# Patient Record
Sex: Female | Born: 2000
Health system: Southern US, Community
[De-identification: ages and names within clinical notes are randomized; demographics above are authoritative.]

## PROBLEM LIST (undated history)

## (undated) HISTORY — PX: MYRINGOTOMY: SUR874

---
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
## 2021-02-20 MED FILL — LARIN FE 1.5-30 TABLET: 1.5-30 | 84 days supply | Qty: 84 | Fill #1

## 2021-05-15 DIAGNOSIS — Z20828 Contact with and (suspected) exposure to other viral communicable diseases: Secondary | ICD-10-CM | POA: Diagnosis not present

## 2021-05-25 DIAGNOSIS — Z20828 Contact with and (suspected) exposure to other viral communicable diseases: Secondary | ICD-10-CM | POA: Diagnosis not present

## 2021-09-26 DIAGNOSIS — J029 Acute pharyngitis, unspecified: Secondary | ICD-10-CM | POA: Diagnosis not present

## 2021-09-27 ENCOUNTER — Telehealth: Payer: 59 | Admitting: Physician Assistant

## 2021-09-27 DIAGNOSIS — H1031 Unspecified acute conjunctivitis, right eye: Secondary | ICD-10-CM | POA: Diagnosis not present

## 2021-09-27 MED ORDER — POLYMYXIN B-TRIMETHOPRIM 10000-0.1 UNIT/ML-% OP SOLN: 1.0000 [drp] | OPHTHALMIC | 0 refills | Status: DC

## 2021-09-27 NOTE — Progress Notes (Signed)

## 2021-09-29 DIAGNOSIS — R059 Cough, unspecified: Secondary | ICD-10-CM | POA: Diagnosis not present

## 2021-09-29 DIAGNOSIS — Z03818 Encounter for observation for suspected exposure to other biological agents ruled out: Secondary | ICD-10-CM | POA: Diagnosis not present

## 2021-09-29 DIAGNOSIS — J039 Acute tonsillitis, unspecified: Secondary | ICD-10-CM | POA: Diagnosis not present

## 2021-09-29 DIAGNOSIS — R0981 Nasal congestion: Secondary | ICD-10-CM | POA: Diagnosis not present

## 2021-09-29 DIAGNOSIS — R509 Fever, unspecified: Secondary | ICD-10-CM | POA: Diagnosis not present

## 2021-11-08 DIAGNOSIS — R21 Rash and other nonspecific skin eruption: Secondary | ICD-10-CM | POA: Diagnosis not present

## 2021-12-01 DIAGNOSIS — Z0000 Encounter for general adult medical examination without abnormal findings: Secondary | ICD-10-CM | POA: Diagnosis not present

## 2021-12-31 ENCOUNTER — Telehealth: Payer: 59 | Admitting: Family

## 2021-12-31 DIAGNOSIS — J029 Acute pharyngitis, unspecified: Secondary | ICD-10-CM | POA: Diagnosis not present

## 2021-12-31 MED ORDER — AMOXICILLIN 500 MG PO CAPS: 500.0000 mg | ORAL_CAPSULE | Freq: Two times a day (BID) | ORAL | 0 refills | Status: AC

## 2021-12-31 NOTE — Progress Notes (Signed)
Virtual Visit Consent   Katherine Blanchard, you are scheduled for a virtual visit with a [facility] provider today.     Just as with appointments in the office, your consent must be obtained to participate.  Your consent will be active for this visit and any virtual visit you may have with one of our providers in the next 365 days.     If you have a MyChart account, a copy of this consent can be sent to you electronically.  All virtual visits are billed to your insurance company just like a traditional visit in the office.    As this is a virtual visit, video technology does not allow for your provider to perform a traditional examination.  This may limit your provider's ability to fully assess your condition.  If your provider identifies any concerns that need to be evaluated in person or the need to arrange testing (such as labs, EKG, etc.), we will make arrangements to do so.     Although advances in technology are sophisticated, we cannot ensure that it will always work on either your end or our end.  If the connection with a video visit is poor, the visit may have to be switched to a telephone visit.  With either a video or telephone visit, we are not always able to ensure that we have a secure connection.     I need to obtain your verbal consent now.   Are you willing to proceed with your visit today?    Katherine Blanchard has provided verbal consent on 12/31/2021 for a virtual visit (video or telephone).   Katherine Rodney, FNP   Date: 12/31/2021 4:16 PM   Virtual Visit via Video Note   I, Katherine Blanchard, connected with  Katherine Blanchard  ([id number], 06/21/2001) on 12/31/21 at  4:15 PM EST by a video-enabled telemedicine application and verified that I am speaking with the correct person using two identifiers.  Location: Patient: Virtual Visit Location Patient: Home Provider: Virtual Visit Location Provider: Home Office   I discussed the limitations of evaluation and management by  telemedicine and the availability of in person appointments. The patient expressed understanding and agreed to proceed.    History of Present Illness: Katherine Blanchard is a 21 y.o. who identifies as a female who was assigned female at birth, and is being seen today for sore throat.  HPI: Sore Throat  This is a new problem. The current episode started yesterday. The problem has been gradually worsening. There has been no fever. The pain is at a severity of 7/10. The pain is moderate. Associated symptoms include congestion, headaches, swollen glands and trouble swallowing. Pertinent negatives include no coughing, ear discharge, ear pain or shortness of breath. Associated symptoms comments: Tonsils enlarged and white patches bilaterally . She has had no exposure to strep or mono. She has tried acetaminophen and NSAIDs for the symptoms. The treatment provided mild relief.   Problems:  Patient Active Problem List   Diagnosis Date Noted   Strain of right hamstring 03/02/2020   Ingrown toenail 07/15/2018   Left wrist injury, initial encounter 12/11/2017    Allergies: No Known Allergies Medications:  Current Outpatient Medications:    amoxicillin (AMOXIL) 500 MG capsule, Take 1 capsule (500 mg total) by mouth 2 (two) times daily for 10 days., Disp: 20 capsule, Rfl: 0  Observations/Objective: Patient is well-developed, well-nourished in no acute distress.  Resting comfortably  at home.  Head is normocephalic, atraumatic.  No labored breathing.  Speech is clear and coherent with logical content.  Patient is alert and oriented at baseline.  Tonsils enlarged 2+, white patches  Talking like hot potatoes in mouth.   Assessment and Plan: 1. Acute pharyngitis, unspecified etiology - amoxicillin (AMOXIL) 500 MG capsule; Take 1 capsule (500 mg total) by mouth 2 (two) times daily for 10 days.  Dispense: 20 capsule; Refill: 0  - Take meds as prescribed - Use a cool mist humidifier  -Use saline nose  sprays frequently -Force fluids -For any cough or congestion  Use plain Mucinex- regular strength or max strength is fine -For fever or aces or pains- take tylenol or ibuprofen. -Throat lozenges if help -New toothbrush in 3 days   Katherine Rodney, FNP   Follow Up Instructions: I discussed the assessment and treatment plan with the patient. The patient was provided an opportunity to ask questions and all were answered. The patient agreed with the plan and demonstrated an understanding of the instructions.  A copy of instructions were sent to the patient via MyChart unless otherwise noted below.     The patient was advised to call back or seek an in-person evaluation if the symptoms worsen or if the condition fails to improve as anticipated.  Time:  I spent 8 minutes with the patient via telehealth technology discussing the above problems/concerns.    Katherine Rodney, FNP

## 2022-02-19 DIAGNOSIS — E282 Polycystic ovarian syndrome: Secondary | ICD-10-CM | POA: Diagnosis not present

## 2022-02-19 DIAGNOSIS — N912 Amenorrhea, unspecified: Secondary | ICD-10-CM | POA: Diagnosis not present

## 2022-04-04 DIAGNOSIS — E282 Polycystic ovarian syndrome: Secondary | ICD-10-CM | POA: Diagnosis not present

## 2022-04-05 DIAGNOSIS — N926 Irregular menstruation, unspecified: Secondary | ICD-10-CM | POA: Diagnosis not present

## 2022-04-05 DIAGNOSIS — E282 Polycystic ovarian syndrome: Secondary | ICD-10-CM | POA: Diagnosis not present

## 2022-04-30 DIAGNOSIS — J029 Acute pharyngitis, unspecified: Secondary | ICD-10-CM | POA: Diagnosis not present

## 2022-05-22 DIAGNOSIS — N760 Acute vaginitis: Secondary | ICD-10-CM | POA: Diagnosis not present

## 2022-05-22 DIAGNOSIS — N921 Excessive and frequent menstruation with irregular cycle: Secondary | ICD-10-CM | POA: Diagnosis not present

## 2022-05-22 DIAGNOSIS — Z01419 Encounter for gynecological examination (general) (routine) without abnormal findings: Secondary | ICD-10-CM | POA: Diagnosis not present

## 2022-05-22 DIAGNOSIS — Z3040 Encounter for surveillance of contraceptives, unspecified: Secondary | ICD-10-CM | POA: Diagnosis not present

## 2022-06-11 DIAGNOSIS — H6123 Impacted cerumen, bilateral: Secondary | ICD-10-CM | POA: Diagnosis not present

## 2022-08-06 DIAGNOSIS — J029 Acute pharyngitis, unspecified: Secondary | ICD-10-CM | POA: Diagnosis not present

## 2022-08-06 DIAGNOSIS — Z03818 Encounter for observation for suspected exposure to other biological agents ruled out: Secondary | ICD-10-CM | POA: Diagnosis not present

## 2022-08-06 DIAGNOSIS — R509 Fever, unspecified: Secondary | ICD-10-CM | POA: Diagnosis not present

## 2022-08-06 DIAGNOSIS — J020 Streptococcal pharyngitis: Secondary | ICD-10-CM | POA: Diagnosis not present

## 2022-08-06 DIAGNOSIS — R52 Pain, unspecified: Secondary | ICD-10-CM | POA: Diagnosis not present

## 2023-03-11 ENCOUNTER — Encounter: Payer: Self-pay | Admitting: *Deleted

## 2023-04-09 DIAGNOSIS — Z6824 Body mass index (BMI) 24.0-24.9, adult: Secondary | ICD-10-CM | POA: Diagnosis not present

## 2023-04-09 DIAGNOSIS — G4709 Other insomnia: Secondary | ICD-10-CM | POA: Diagnosis not present

## 2023-04-09 DIAGNOSIS — R195 Other fecal abnormalities: Secondary | ICD-10-CM | POA: Diagnosis not present

## 2023-04-09 DIAGNOSIS — F411 Generalized anxiety disorder: Secondary | ICD-10-CM | POA: Diagnosis not present

## 2023-05-14 ENCOUNTER — Other Ambulatory Visit (HOSPITAL_COMMUNITY): Payer: Self-pay

## 2023-05-14 MED ORDER — ESCITALOPRAM OXALATE 5 MG PO TABS
5.0000 mg | ORAL_TABLET | Freq: Every day | ORAL | 0 refills | Status: AC
  Filled 2023-05-14: qty 30, 30d supply, fill #0

## 2023-05-22 DIAGNOSIS — G4709 Other insomnia: Secondary | ICD-10-CM | POA: Diagnosis not present

## 2023-05-22 DIAGNOSIS — F411 Generalized anxiety disorder: Secondary | ICD-10-CM | POA: Diagnosis not present

## 2023-05-22 DIAGNOSIS — Z6824 Body mass index (BMI) 24.0-24.9, adult: Secondary | ICD-10-CM | POA: Diagnosis not present

## 2023-06-12 DIAGNOSIS — Z6824 Body mass index (BMI) 24.0-24.9, adult: Secondary | ICD-10-CM | POA: Diagnosis not present

## 2023-06-12 DIAGNOSIS — H0014 Chalazion left upper eyelid: Secondary | ICD-10-CM | POA: Diagnosis not present

## 2023-06-19 ENCOUNTER — Other Ambulatory Visit (HOSPITAL_COMMUNITY): Payer: Self-pay

## 2023-06-19 DIAGNOSIS — Z3040 Encounter for surveillance of contraceptives, unspecified: Secondary | ICD-10-CM | POA: Diagnosis not present

## 2023-06-19 DIAGNOSIS — Z113 Encounter for screening for infections with a predominantly sexual mode of transmission: Secondary | ICD-10-CM | POA: Diagnosis not present

## 2023-06-19 DIAGNOSIS — Z01411 Encounter for gynecological examination (general) (routine) with abnormal findings: Secondary | ICD-10-CM | POA: Diagnosis not present

## 2023-06-19 DIAGNOSIS — N939 Abnormal uterine and vaginal bleeding, unspecified: Secondary | ICD-10-CM | POA: Diagnosis not present

## 2023-06-19 DIAGNOSIS — A499 Bacterial infection, unspecified: Secondary | ICD-10-CM | POA: Diagnosis not present

## 2023-06-19 DIAGNOSIS — N760 Acute vaginitis: Secondary | ICD-10-CM | POA: Diagnosis not present

## 2023-06-19 DIAGNOSIS — Z01419 Encounter for gynecological examination (general) (routine) without abnormal findings: Secondary | ICD-10-CM | POA: Diagnosis not present

## 2023-06-19 MED ORDER — TINIDAZOLE 500 MG PO TABS
ORAL_TABLET | ORAL | 1 refills | Status: AC
  Filled 2023-06-19: qty 8, 2d supply, fill #0

## 2023-06-19 MED ORDER — XACIATO 2 % VA GEL
VAGINAL | 1 refills | Status: AC
  Filled 2023-06-19: qty 8, 28d supply, fill #0

## 2023-06-20 ENCOUNTER — Other Ambulatory Visit (HOSPITAL_COMMUNITY): Payer: Self-pay

## 2023-06-20 MED ORDER — TRUE VITAMIN D3 1.25 MG (50000 UT) PO CAPS
50000.0000 [IU] | ORAL_CAPSULE | ORAL | 0 refills | Status: AC
  Filled 2023-06-20: qty 8, 56d supply, fill #0

## 2023-08-12 DIAGNOSIS — S80811A Abrasion, right lower leg, initial encounter: Secondary | ICD-10-CM | POA: Diagnosis not present

## 2023-08-12 DIAGNOSIS — Z23 Encounter for immunization: Secondary | ICD-10-CM | POA: Diagnosis not present

## 2023-09-02 DIAGNOSIS — K143 Hypertrophy of tongue papillae: Secondary | ICD-10-CM | POA: Diagnosis not present

## 2023-11-12 DIAGNOSIS — Z6825 Body mass index (BMI) 25.0-25.9, adult: Secondary | ICD-10-CM | POA: Diagnosis not present

## 2023-11-12 DIAGNOSIS — F411 Generalized anxiety disorder: Secondary | ICD-10-CM | POA: Diagnosis not present

## 2023-11-12 DIAGNOSIS — G4709 Other insomnia: Secondary | ICD-10-CM | POA: Diagnosis not present

## 2023-12-02 ENCOUNTER — Ambulatory Visit
Admission: EM | Admit: 2023-12-02 | Discharge: 2023-12-02 | Disposition: A | Discharge status: Home / Self Care | Payer: 59 | Attending: Family Medicine | Admitting: Family Medicine

## 2023-12-02 DIAGNOSIS — B349 Viral infection, unspecified: Secondary | ICD-10-CM | POA: Insufficient documentation

## 2023-12-02 DIAGNOSIS — J029 Acute pharyngitis, unspecified: Secondary | ICD-10-CM | POA: Insufficient documentation

## 2023-12-02 LAB — POCT RAPID STREP A (OFFICE): Rapid Strep A Screen: NEGATIVE

## 2023-12-02 NOTE — ED Provider Notes (Addendum)
 [facility]    CSN: [id number] Arrival date & time: 12/02/23  1203      History   Chief Complaint Chief Complaint  Patient presents with   Sore Throat    HPI Katherine Blanchard is a 23 y.o. female  presents for evaluation of URI symptoms for 2 days. Patient reports associated symptoms of sore throat and nasal congestion. Denies N/V/D, fevers, cough, ear pain, body aches, shortness of breath. Patient does not have a hx of asthma. Patient is not an active smoker.   Reports no sick contacts.  Pt has taken nothing OTC for symptoms. Pt has no other concerns at this time.    Sore Throat    History reviewed. No pertinent past medical history.  Patient Active Problem List   Diagnosis Date Noted   Strain of right hamstring 03/02/2020   Ingrown toenail 07/15/2018   Left wrist injury, initial encounter 12/11/2017    Past Surgical History:  Procedure Laterality Date   MYRINGOTOMY      OB History   No obstetric history on file.      Home Medications    Prior to Admission medications   Medication Sig Start Date End Date Taking? Authorizing Provider  Cholecalciferol  (TRUE VITAMIN D3) 1.25 MG (50000 UT) capsule Take 1 capsule by mouth once a week. 06/20/23     Clindamycin  Phosphate (XACIATO ) 2 % GEL Insert 1 applicatorful every day by vaginal route as needed. 06/19/23     escitalopram  (LEXAPRO ) 5 MG tablet Take 1 tablet (5 mg) by mouth daily. 04/09/23     tinidazole  (TINDAMAX ) 500 MG tablet Take 4 tablets every day by oral route as needed for 2 days. 06/19/23       Family History History reviewed. No pertinent family history.  Social History Social History   Tobacco Use   Smoking status: Never   Smokeless tobacco: Never  Substance Use Topics   Alcohol use: No   Drug use: No     Allergies   Patient has no known allergies.   Review of Systems Review of Systems  HENT:  Positive for congestion and sore throat.      Physical Exam Triage Vital Signs ED  Triage Vitals  Encounter Vitals Group     BP 12/02/23 1254 107/72     Systolic BP Percentile --      Diastolic BP Percentile --      Pulse Rate 12/02/23 1254 (!) 56     Resp 12/02/23 1254 17     Temp 12/02/23 1254 98.7 F (37.1 C)     Temp Source 12/02/23 1254 Oral     SpO2 12/02/23 1254 96 %     Weight --      Height --      Head Circumference --      Peak Flow --      Pain Score 12/02/23 1253 0     Pain Loc --      Pain Education --      Exclude from Growth Chart --    No data found.  Updated Vital Signs BP 107/72 (BP Location: Left Arm)   Pulse (!) 56   Temp 98.7 F (37.1 C) (Oral)   Resp 17   LMP 09/09/2023 (Exact Date)   SpO2 96%   Visual Acuity Right Eye Distance:   Left Eye Distance:   Bilateral Distance:    Right Eye Near:   Left Eye Near:    Bilateral Near:  Physical Exam Vitals and nursing note reviewed.  Constitutional:      General: She is not in acute distress.    Appearance: She is well-developed. She is not ill-appearing.  HENT:     Head: Normocephalic and atraumatic.     Right Ear: Tympanic membrane and ear canal normal.     Left Ear: Tympanic membrane and ear canal normal.     Nose: Congestion present.     Mouth/Throat:     Mouth: Mucous membranes are moist.     Pharynx: Oropharynx is clear. Uvula midline. Posterior oropharyngeal erythema present. No uvula swelling.     Tonsils: No tonsillar exudate or tonsillar abscesses. 3+ on the right. 3+ on the left.  Eyes:     Conjunctiva/sclera: Conjunctivae normal.     Pupils: Pupils are equal, round, and reactive to light.  Cardiovascular:     Rate and Rhythm: Normal rate and regular rhythm.     Heart sounds: Normal heart sounds.  Pulmonary:     Effort: Pulmonary effort is normal.     Breath sounds: Normal breath sounds.  Musculoskeletal:     Cervical back: Normal range of motion and neck supple.  Lymphadenopathy:     Cervical: No cervical adenopathy.  Skin:    General: Skin is warm and  dry.  Neurological:     General: No focal deficit present.     Mental Status: She is alert and oriented to person, place, and time.  Psychiatric:        Mood and Affect: Mood normal.        Behavior: Behavior normal.      UC Treatments / Results  Labs (all labs ordered are listed, but only abnormal results are displayed) Labs Reviewed  CULTURE, GROUP A STREP [facility])  POCT RAPID STREP A (OFFICE)    EKG   Radiology No results found.  Procedures Procedures (including critical care time)  Medications Ordered in UC Medications - No data to display  Initial Impression / Assessment and Plan / UC Course  I have reviewed the triage vital signs and the nursing notes.  Pertinent labs & imaging results that were available during my care of the patient were reviewed by me and considered in my medical decision making (see chart for details).     Reviewed exam and symptoms with patient.  No red flags.  Negative rapid strep, will culture.  Discussed treatment given presentation but patient prefers to wait for results prior to starting treatment.  Patient declined COVID or flu testing.  Discussed viral illness and symptomatic treatment.  PCP follow-up as symptoms do not improve.  ER precautions reviewed. Final Clinical Impressions(s) / UC Diagnoses   Final diagnoses:  Sore throat  Viral illness     Discharge Instructions      The clinic will contact you with results of the strep throat culture done today if positive.  Lots of rest and fluids such as salt water gargles and warm liquids like teas and honey.  He may take over-the-counter Tylenol  or ibuprofen  as needed for throat pain.  Please follow-up with your PCP if your symptoms do not improve.  Please go to the ER if you develop any worsening symptoms.  I hope you feel better soon!     ED Prescriptions   None    PDMP not reviewed this encounter.   Loreda Myla SAUNDERS, NP 12/02/23 1408    Loreda Myla SAUNDERS, NP 12/02/23 [phone]

## 2023-12-02 NOTE — Discharge Instructions (Addendum)
 The clinic will contact you with results of the strep throat culture done today if positive.  Lots of rest and fluids such as salt water gargles and warm liquids like teas and honey.  He may take over-the-counter Tylenol  or ibuprofen  as needed for throat pain.  Please follow-up with your PCP if your symptoms do not improve.  Please go to the ER if you develop any worsening symptoms.  I hope you feel better soon!

## 2023-12-02 NOTE — ED Triage Notes (Signed)
 Pt presents with c/o sore throat and nasal congestion X 2 days.   Pt states she has a few white spots on her tonsils. Denies fever.   Home interventions: none.

## 2023-12-05 LAB — CULTURE, GROUP A STREP (THRC)

## 2023-12-11 DIAGNOSIS — J029 Acute pharyngitis, unspecified: Secondary | ICD-10-CM | POA: Diagnosis not present

## 2023-12-11 DIAGNOSIS — J00 Acute nasopharyngitis [common cold]: Secondary | ICD-10-CM | POA: Diagnosis not present

## 2024-01-06 DIAGNOSIS — F411 Generalized anxiety disorder: Secondary | ICD-10-CM | POA: Diagnosis not present

## 2024-01-06 DIAGNOSIS — Z6825 Body mass index (BMI) 25.0-25.9, adult: Secondary | ICD-10-CM | POA: Diagnosis not present

## 2024-01-06 DIAGNOSIS — G4709 Other insomnia: Secondary | ICD-10-CM | POA: Diagnosis not present

## 2024-02-19 DIAGNOSIS — Z1330 Encounter for screening examination for mental health and behavioral disorders, unspecified: Secondary | ICD-10-CM | POA: Diagnosis not present

## 2024-02-19 DIAGNOSIS — H01001 Unspecified blepharitis right upper eyelid: Secondary | ICD-10-CM | POA: Diagnosis not present

## 2024-11-09 ENCOUNTER — Other Ambulatory Visit (HOSPITAL_COMMUNITY): Payer: Self-pay

## 2024-11-09 DIAGNOSIS — N920 Excessive and frequent menstruation with regular cycle: Secondary | ICD-10-CM | POA: Diagnosis not present

## 2024-11-09 MED ORDER — ETONOGESTREL-ETHINYL ESTRADIOL 0.12-0.015 MG/24HR VA RING: VAGINAL_RING | VAGINAL | 0 refills | Status: AC

## 2024-11-09 MED ORDER — ETONOGESTREL-ETHINYL ESTRADIOL 0.12-0.015 MG/24HR VA RING
VAGINAL_RING | VAGINAL | 0 refills | Status: AC
  Filled 2024-11-09: qty 3, 63d supply, fill #0

## 2024-11-10 ENCOUNTER — Other Ambulatory Visit (HOSPITAL_COMMUNITY): Payer: Self-pay

## 2024-11-23 DIAGNOSIS — B349 Viral infection, unspecified: Secondary | ICD-10-CM | POA: Diagnosis not present

## 2024-11-23 DIAGNOSIS — J069 Acute upper respiratory infection, unspecified: Secondary | ICD-10-CM | POA: Diagnosis not present

## 2024-11-23 DIAGNOSIS — R52 Pain, unspecified: Secondary | ICD-10-CM | POA: Diagnosis not present

## 2024-11-23 DIAGNOSIS — Z20828 Contact with and (suspected) exposure to other viral communicable diseases: Secondary | ICD-10-CM | POA: Diagnosis not present
